# Patient Record
Sex: Male | Born: 1960 | Race: Black or African American | Hispanic: No | Marital: Single | Smoking: Current every day smoker
Health system: Southern US, Community
[De-identification: ages and names within clinical notes are randomized; demographics above are authoritative.]

## PROBLEM LIST (undated history)

## (undated) DIAGNOSIS — E119 Type 2 diabetes mellitus without complications: Secondary | ICD-10-CM

## (undated) DIAGNOSIS — I10 Essential (primary) hypertension: Secondary | ICD-10-CM

## (undated) HISTORY — PX: KNEE ARTHROSCOPY: SUR90

## (undated) HISTORY — DX: Essential (primary) hypertension: I10

## (undated) HISTORY — DX: Type 2 diabetes mellitus without complications: E11.9

---

## 2016-06-25 HISTORY — PX: CORONARY ANGIOPLASTY: SHX604

## 2016-09-12 DIAGNOSIS — I219 Acute myocardial infarction, unspecified: Secondary | ICD-10-CM

## 2016-09-12 HISTORY — DX: Acute myocardial infarction, unspecified: I21.9

## 2020-09-30 ENCOUNTER — Emergency Department (HOSPITAL_COMMUNITY)
Admission: EM | Admit: 2020-09-30 | Discharge: 2020-09-30 | Disposition: A | Discharge status: Home / Self Care | Payer: Medicare PPO | Attending: Emergency Medicine | Admitting: Emergency Medicine

## 2020-09-30 ENCOUNTER — Emergency Department (HOSPITAL_COMMUNITY): Admission: EM | Admit: 2020-09-30 | Payer: Medicare PPO | Source: Home / Self Care

## 2020-09-30 ENCOUNTER — Emergency Department (HOSPITAL_COMMUNITY): Payer: Medicare PPO

## 2020-09-30 DIAGNOSIS — R42 Dizziness and giddiness: Secondary | ICD-10-CM | POA: Diagnosis present

## 2020-09-30 DIAGNOSIS — H5500 Unspecified nystagmus: Secondary | ICD-10-CM | POA: Diagnosis not present

## 2020-09-30 DIAGNOSIS — R110 Nausea: Secondary | ICD-10-CM | POA: Insufficient documentation

## 2020-09-30 DIAGNOSIS — I2510 Atherosclerotic heart disease of native coronary artery without angina pectoris: Secondary | ICD-10-CM | POA: Insufficient documentation

## 2020-09-30 DIAGNOSIS — H53149 Visual discomfort, unspecified: Secondary | ICD-10-CM | POA: Insufficient documentation

## 2020-09-30 DIAGNOSIS — R519 Headache, unspecified: Secondary | ICD-10-CM | POA: Diagnosis not present

## 2020-09-30 LAB — CBC
HCT: 46.2 % (ref 39.0–52.0)
Hemoglobin: 15.8 g/dL (ref 13.0–17.0)
MCH: 28.8 pg (ref 26.0–34.0)
MCHC: 34.2 g/dL (ref 30.0–36.0)
MCV: 84.2 fL (ref 80.0–100.0)
Platelets: 127 10*3/uL — ABNORMAL LOW (ref 150–400)
RBC: 5.49 MIL/uL (ref 4.22–5.81)
RDW: 12.3 % (ref 11.5–15.5)
WBC: 6.5 10*3/uL (ref 4.0–10.5)
nRBC: 0 % (ref 0.0–0.2)

## 2020-09-30 LAB — BASIC METABOLIC PANEL
Anion gap: 5 (ref 5–15)
BUN: 16 mg/dL (ref 8–23)
CO2: 29 mmol/L (ref 22–32)
Calcium: 9.4 mg/dL (ref 8.9–10.3)
Chloride: 101 mmol/L (ref 98–111)
Creatinine, Ser: 1.22 mg/dL (ref 0.61–1.24)
GFR, Estimated: >60 mL/min (ref >60)
Glucose, Bld: 195 mg/dL — ABNORMAL HIGH (ref 70–99)
Potassium: 4.4 mmol/L (ref 3.5–5.1)
Sodium: 135 mmol/L (ref 135–145)

## 2020-09-30 MED ORDER — SODIUM CHLORIDE 0.9 % IV BOLUS
1000.0000 mL | Freq: Once | INTRAVENOUS | Status: AC
  Administered 2020-09-30: 1000 mL via INTRAVENOUS

## 2020-09-30 MED ORDER — PROCHLORPERAZINE EDISYLATE 10 MG/2ML IJ SOLN
5.0000 mg | Freq: Once | INTRAMUSCULAR | Status: AC
  Administered 2020-09-30: 5 mg via INTRAVENOUS
  Filled 2020-09-30: qty 2

## 2020-09-30 NOTE — ED Triage Notes (Signed)
Patient BIB [facility] from home for sudden onset of dizziness, shortness of breath, and nausea starting at approximately 1130. No focal neurological deficits. Received 4mg  zofran, 18g saline lock in left AC. Patient alert and oriented at this time.

## 2020-09-30 NOTE — ED Provider Notes (Signed)
[facility] EMERGENCY DEPARTMENT Provider Note   CSN: [id number] Arrival date & time: 09/30/20  1306     History No chief complaint on file.   Clifford Mcgee is a 60 y.o. male.  HPI Patient presents with dizziness and nausea.  Began acutely at around 1130.  Has had some Zofran by EMS.  States he feels as if the room is spinning around.  Worse if he moves his head.  Worse if he moves around.  Has had nausea.  States he has had some dizziness before that is resolved.  States he has had some ringing in his right ear.  No other numbness or weakness.  States he has had previous heart stents.  States he also has a headache and his vision is a little blurred.    CAD with stent There are no problems to display for this patient.        No family history on file.     Home Medications Prior to Admission medications   Not on File    Allergies    Patient has no allergy information on record.  Review of Systems   Review of Systems  Constitutional: Negative for appetite change.  HENT: Negative for congestion.   Respiratory: Negative for shortness of breath.   Cardiovascular: Negative for chest pain.  Gastrointestinal: Positive for nausea.  Genitourinary: Negative for flank pain.  Musculoskeletal: Negative for back pain.  Skin: Negative for rash.  Neurological: Positive for dizziness.  Psychiatric/Behavioral: Negative for confusion.    Physical Exam Updated Vital Signs BP (!) 154/54 (BP Location: Right Arm)   Pulse (!) 55   Temp 98 F (36.7 C) (Oral)   Resp 16   SpO2 100%   Physical Exam Vitals reviewed.  HENT:     Head: Atraumatic.     Right Ear: Tympanic membrane normal.     Left Ear: Tympanic membrane normal.     Mouth/Throat:     Mouth: Mucous membranes are moist.  Eyes:     Extraocular Movements: Extraocular movements intact.     Pupils: Pupils are equal, round, and reactive to light.     Comments: Nystagmus, particularly with gaze to right.   Cardiovascular:     Rate and Rhythm: Normal rate and regular rhythm.  Pulmonary:     Breath sounds: No wheezing, rhonchi or rales.  Abdominal:     Tenderness: There is no abdominal tenderness.  Musculoskeletal:        General: No tenderness.     Cervical back: Neck supple.  Skin:    General: Skin is warm.     Capillary Refill: Capillary refill takes less than 2 seconds.  Neurological:     Mental Status: He is alert.     Comments: Awake and pleasant.  Face symmetric.  Eye movements intact does have some nystagmus.  Vertigo is different if he turns his head to the right.  Finger-nose intact bilaterally.     ED Results / Procedures / Treatments   Labs (all labs ordered are listed, but only abnormal results are displayed) Labs Reviewed  BASIC METABOLIC PANEL - Abnormal; Notable for the following components:      Result Value   Glucose, Bld 195 (*)    All other components within normal limits  CBC - Abnormal; Notable for the following components:   Platelets 127 (*)    All other components within normal limits    EKG None  Radiology MR BRAIN WO CONTRAST  Result  Date: 09/30/2020 CLINICAL DATA:  Dizziness. EXAM: MRI HEAD WITHOUT CONTRAST TECHNIQUE: Multiplanar, multiecho pulse sequences of the brain and surrounding structures were obtained without intravenous contrast. COMPARISON:  None. FINDINGS: Brain: There is no evidence of an acute infarct, mass, midline shift, or extra-axial fluid collection. A chronic microhemorrhage is noted in the posterior left temporal lobe. T2 hyperintensities in the cerebral white matter bilaterally are nonspecific but compatible with mild chronic small vessel ischemic disease. There are chronic lacunar infarcts in the right putamen and left frontal periventricular white matter. The brainstem and cerebellum are normal in signal. The ventricles and sulci are within normal limits for age. Vascular: Major intracranial vascular flow voids are preserved. Skull  and upper cervical spine: Unremarkable bone marrow signal. Sinuses/Orbits: Unremarkable orbits. Minimal bilateral ethmoid sinus mucosal thickening. Small bilateral mastoid effusions. Other: None. IMPRESSION: 1. No acute intracranial abnormality. 2. Mild chronic small vessel ischemic disease. Electronically Signed   By: Sebastian Ache M.D.   On: 09/30/2020 17:07    Procedures Procedures   Medications Ordered in ED Medications  prochlorperazine (COMPAZINE) injection 5 mg (5 mg Intravenous Given 09/30/20 1338)  sodium chloride 0.9 % bolus 1,000 mL (0 mLs Intravenous Stopped 09/30/20 1456)    ED Course  I have reviewed the triage vital signs and the nursing notes.  Pertinent labs & imaging results that were available during my care of the patient were reviewed by me and considered in my medical decision making (see chart for details).    MDM Rules/Calculators/A&P                          Patient with acute onset vertigo.  Also has headache and mild photophobia.  Some nystagmus.  Had ringing in the ears.  However with patient's comorbidities it feels as if he is at high risk for central cause of vertigo.  We will get MRI.  If negative likely discharge home.  Care turned over to oncoming provider. Final Clinical Impression(s) / ED Diagnoses Final diagnoses:  Vertigo    Rx / DC Orders ED Discharge Orders    None       Benjiman Core, MD 10/01/20 [phone]

## 2020-09-30 NOTE — ED Notes (Signed)
Patient transported to MRI 

## 2020-09-30 NOTE — ED Provider Notes (Signed)
  Physical Exam  BP (!) 162/85 (BP Location: Right Arm)   Pulse 62   Resp 18   SpO2 100%   Physical Exam  ED Course/Procedures     Procedures  MDM  Clifford Mcgee was evaluated for dizziness that seem to be related to peripheral vertigo.  Labs are within normal limits, and his MRI is also normal.  He will be discharged with instructions for symptomatic treatments.       Koleen Distance, MD 09/30/20 [phone]

## 2020-10-31 ENCOUNTER — Ambulatory Visit: Payer: Medicare PPO | Admitting: Cardiology

## 2020-11-07 ENCOUNTER — Encounter: Payer: Self-pay | Admitting: Cardiology

## 2020-11-07 ENCOUNTER — Ambulatory Visit: Payer: Medicare PPO | Admitting: Cardiology

## 2020-11-07 ENCOUNTER — Other Ambulatory Visit: Payer: Self-pay

## 2020-11-07 VITALS — BP 139/72 | HR 62 | Temp 98.1°F | Resp 16 | Ht >= 80 in | Wt 175.2 lb

## 2020-11-07 DIAGNOSIS — F17200 Nicotine dependence, unspecified, uncomplicated: Secondary | ICD-10-CM

## 2020-11-07 DIAGNOSIS — I6523 Occlusion and stenosis of bilateral carotid arteries: Secondary | ICD-10-CM

## 2020-11-07 DIAGNOSIS — I739 Peripheral vascular disease, unspecified: Secondary | ICD-10-CM

## 2020-11-07 DIAGNOSIS — I10 Essential (primary) hypertension: Secondary | ICD-10-CM

## 2020-11-07 DIAGNOSIS — I2510 Atherosclerotic heart disease of native coronary artery without angina pectoris: Secondary | ICD-10-CM

## 2020-11-07 DIAGNOSIS — E782 Mixed hyperlipidemia: Secondary | ICD-10-CM

## 2020-11-07 DIAGNOSIS — E119 Type 2 diabetes mellitus without complications: Secondary | ICD-10-CM

## 2020-11-07 DIAGNOSIS — R1900 Intra-abdominal and pelvic swelling, mass and lump, unspecified site: Secondary | ICD-10-CM

## 2020-11-07 NOTE — Progress Notes (Signed)
Primary Physician/Referring:  Criss Rosales, [facility]  Patient ID: Clifford Mcgee, male    DOB: Oct 26, 1960, 60 y.o.   MRN: [id number]  Chief Complaint  Patient presents with  . Old MI  . New Patient (Initial Visit)    Referred by Lucianne Lei, MD, patient recently moved to [facility] from [place], wants to establish cardiac here in this new city.   HPI:    Clifford Mcgee  is a 60 y.o. African-American male patient with hypertension, hyperlipidemia, diabetes mellitus type 2, coronary artery disease 09/14/2016 in the left circumflex, at that time was also noted to have chronically occluded right coronary artery, asymptomatic bilateral carotid artery stenosis, history of tobacco use disorder referred to me to establish cardiac care.    Past Medical History:  Diagnosis Date  . Diabetes mellitus without complication ([place])   . Heart attack ([place]) 09/12/2016  . Hypertension    Past Surgical History:  Procedure Laterality Date  . CORONARY ANGIOPLASTY  2018  . KNEE ARTHROSCOPY Right    Family History  Problem Relation Age of Onset  . Hypertension Mother   . Heart failure Mother   . Alcoholism Father   . Breast cancer Sister     Social History   Tobacco Use  . Smoking status: Current Every Day Smoker    Packs/day: 0.25    Years: 45.00    Pack years: 11.25    Types: Cigars  . Smokeless tobacco: Never Used  Substance Use Topics  . Alcohol use: Yes    Alcohol/week: 1.0 standard drink    Types: 1 Glasses of wine per week    Comment: seldom   Marital Status: Single  ROS  Review of Systems  Cardiovascular: Negative for chest pain, dyspnea on exertion and leg swelling.  Musculoskeletal: Positive for arthritis.  Gastrointestinal: Negative for melena.   Objective  Blood pressure 139/72, pulse 62, temperature 98.1 F (36.7 C), temperature source Temporal, resp. rate 16, height 8' (2.438 m), weight 175 lb 3.2 oz (79.5 kg), SpO2 98 %. Body mass index is 13.37 kg/m.  Vitals with BMI 11/07/2020  09/30/2020 09/30/2020  Height '8\' 0"'  - -  Weight 175 lbs 3 oz - -  BMI 95.18 - -  Systolic 841 660 630  Diastolic 72 54 85  Pulse 62 55 62   Physical Exam Neck:     Vascular: No carotid bruit or JVD.  Cardiovascular:     Rate and Rhythm: Normal rate and regular rhythm.     Pulses:          Carotid pulses are on the right side with bruit and on the left side with bruit.      Popliteal pulses are 2+ on the right side and 1+ on the left side.       Dorsalis pedis pulses are 1+ on the right side and 1+ on the left side.       Posterior tibial pulses are 1+ on the right side and 1+ on the left side.     Heart sounds: Normal heart sounds. No murmur heard. No gallop.   Pulmonary:     Effort: Pulmonary effort is normal.     Breath sounds: Normal breath sounds.  Abdominal:     General: Bowel sounds are normal.     Palpations: Abdomen is soft. There is pulsatile mass.  Musculoskeletal:        General: No swelling.      Laboratory examination:   Recent Labs    09/30/20  1325  NA 135  K 4.4  CL 101  CO2 29  GLUCOSE 195*  BUN 16  CREATININE 1.22  CALCIUM 9.4  GFRNONAA >60   CrCl cannot be calculated (Patient's most recent lab result is older than the maximum 21 days allowed.).  CMP Latest Ref Rng & Units 09/30/2020  Glucose 70 - 99 mg/dL 195(H)  BUN 8 - 23 mg/dL 16  Creatinine 0.61 - 1.24 mg/dL 1.22  Sodium 135 - 145 mmol/L 135  Potassium 3.5 - 5.1 mmol/L 4.4  Chloride 98 - 111 mmol/L 101  CO2 22 - 32 mmol/L 29  Calcium 8.9 - 10.3 mg/dL 9.4   CBC Latest Ref Rng & Units 09/30/2020  WBC 4.0 - 10.5 K/uL 6.5  Hemoglobin 13.0 - 17.0 g/dL 15.8  Hematocrit 39.0 - 52.0 % 46.2  Platelets 150 - 400 K/uL 127(L)   External labs:   Labs 09/06/2020:  Total cholesterol 176, triglycerides 115, HDL 47, LDL 108.  Non-HDL cholesterol 129.  Serum glucose 94 mg, BUN 12, creatinine 1.30, EGFR 68 mL, potassium 5.2, sodium 142, liver enzymes normal.  Hb 14.8/HCT 44.1, platelets 134.  Normal  indicis.  PSA is normal, A1c 7.1%.  Medications and allergies   Allergies  Allergen Reactions  . Ibuprofen Hives and Rash     Current Outpatient Medications on File Prior to Visit  Medication Sig Dispense Refill  . amLODipine (NORVASC) 5 MG tablet Take 5 mg by mouth at bedtime. X 10 days then 2 tablets by mouth at bedtime for BP    . aspirin 81 MG EC tablet Take 1 tablet by mouth daily.    Marland Kitchen atorvastatin (LIPITOR) 40 MG tablet Take 1 tablet by mouth daily.    . carvedilol (COREG) 12.5 MG tablet Take 1 tablet by mouth 2 (two) times daily.    . hydrochlorothiazide (HYDRODIURIL) 25 MG tablet Take 1 tablet by mouth in the morning.    . irbesartan (AVAPRO) 300 MG tablet Take 1 tablet by mouth at bedtime.     No current facility-administered medications on file prior to visit.    Radiology:   No results found.  Cardiac Studies:   Coronary angiography 09/14/2016: By chart review, angioplasty and stenting to circumflex coronary artery with DES.  CTO RCA.  Mild disease LAD.  3/18 Echo: Normal LV function, mild inferolateral hypokinesis   Carotid ultrasound 09/03/2017: 1. 0 -49% stenosis of the right internal carotid artery with calcified atherosclerotic plaque.  2. 50-69% stenosis of the left internal carotid artery with homogenous atherosclerotic plaque.  3. Severe right external carotid artery stenosis  4. Antegrade flow in both vertebral arteries.  Aortic artery duplex 09/03/2017: Aorta widest diameter  AP 2.7 cm X Lat 2.7cm  Normal size infrarenal aorta with no evidence for aneurysm.  EKG:     EKG 11/07/2020: Sinus bradycardia at rate of 54 bpm, normal axis, poor R wave progression, cannot exclude anteroseptal infarct old.  Early repolarization.  LVH with repolarization abnormality, cannot exclude lateral ischemia.    Assessment     ICD-10-CM   1. Coronary artery disease involving native coronary artery of native heart without angina pectoris  I25.10 PCV ECHOCARDIOGRAM  COMPLETE  2. Asymptomatic bilateral carotid artery stenosis  I65.23 PCV CAROTID DUPLEX (BILATERAL)  3. Essential hypertension  I10 EKG 12-Lead  4. Mixed hyperlipidemia  E78.2   5. Type 2 diabetes mellitus without complication, without long-term current use of insulin (HCC)  E11.9   6. PAD (peripheral artery disease) ([place])  I73.9 PCV ANKLE BRACHIAL INDEX (ABI)     There are no discontinued medications.  No orders of the defined types were placed in this encounter.  Orders Placed This Encounter  Procedures  . EKG 12-Lead  . PCV ECHOCARDIOGRAM COMPLETE    Standing Status:   Future    Standing Expiration Date:   11/07/2021   Recommendations:   Clifford Mcgee is a 60 y.o.  African-American male patient with hypertension, hyperlipidemia, diabetes mellitus type 2, coronary artery disease 09/14/2016 in the left circumflex, at that time was also noted to have chronically occluded right coronary artery, asymptomatic bilateral carotid artery stenosis, history of tobacco use disorder referred to me to establish cardiac care.  Patient with very complex medical history with ongoing tobacco use disorder, diabetes, hypertension and hyperlipidemia, I reviewed his chart extensively on care everywhere, it appears that he was evaluated for coronary disease but not has had any recent stress test or echocardiogram.  Schedule for a Exercise Nuclear stress test to evaluate for myocardial ischemia. Will schedule for an echocardiogram. Schedule for carotid duplex for bruit/follow-up surveillance of carotid stenosis.  He has abnormal physical exam consistent with peripheral arterial disease as well.  Although asymptomatic for restratification, will obtain ABI.  Otherwise he is on appropriate medical therapy, however lipids are not at goal.  I will address this on his next office visit as it was already a prolonged office visit and evaluation of complex medical issues and reconciliation of medical records.  Either  increase atorvastatin to 80 mg or add Zetia.  I have discussed with him regarding smoking cessation.  He smokes a cigar and advised him that in view of vascular disease and known coronary disease he should certainly avoid even smoking 1 cigar a day.  Complete abstinence and the importance of this was discussed with the patient.  I would like to see him back in 6 weeks for follow-up and I will make further recommendations.  This was a >60-minute office visit encounter with reconciliation of medical records, review of external records, and making complex medical decisions.    Adrian Prows, MD, [facility] 11/07/2020, 9:01 PM Office: [phone]

## 2020-12-06 ENCOUNTER — Other Ambulatory Visit: Payer: Medicare PPO

## 2021-01-30 ENCOUNTER — Ambulatory Visit: Payer: Medicare PPO | Admitting: Cardiology

## 2021-10-31 ENCOUNTER — Telehealth: Payer: Self-pay | Admitting: Cardiology

## 2021-10-31 ENCOUNTER — Encounter: Payer: Self-pay | Admitting: Cardiology

## 2021-10-31 NOTE — Telephone Encounter (Signed)
I sent him a dismissal note on Epic mychart. Do not call and reschedule unless he calls himself

## 2021-10-31 NOTE — Telephone Encounter (Signed)
Multiple attempts have been made to reschedule patient's echocardiogram, aorta duplex, and carotid duplex. Patient either no shows or does not respond. We can try to reach out again as the orders will be expiring soon, please advise. ?

## 2022-11-03 IMAGING — MR MR HEAD W/O CM
10 of 11 series · 43 of 48 positions shown · non-contrast
Comparison: None.

CLINICAL DATA: Dizziness.

EXAM:
MRI HEAD WITHOUT CONTRAST
TECHNIQUE: Multiplanar, multiecho pulse sequences of the brain and surrounding
structures were obtained without intravenous contrast.

[Series 5: DWI · axial · 3.0mm · 0.88mm/px · z∈[-66,+85]mm · 10 of 104 slices shown (1 of 4)]
[im 1/104]
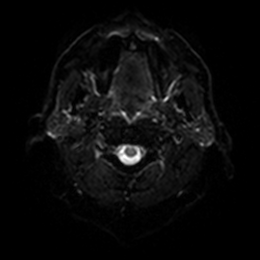
[im 12/104]
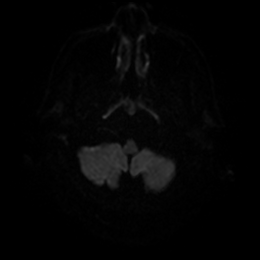
[im 23/104]
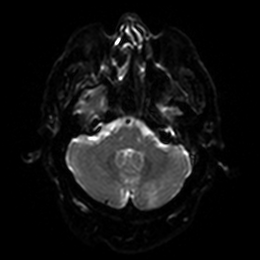
[im 35/104]
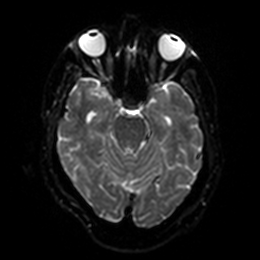
[im 46/104]
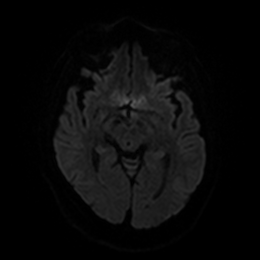
[im 58/104]
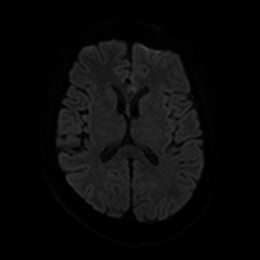
[im 69/104]
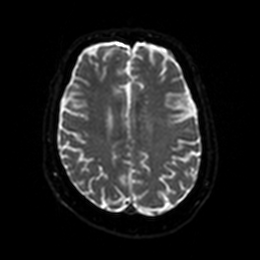
[im 81/104]
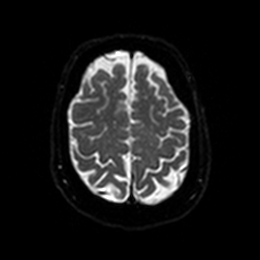
[im 92/104]
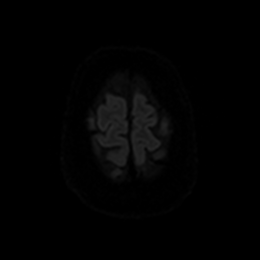
[im 104/104]
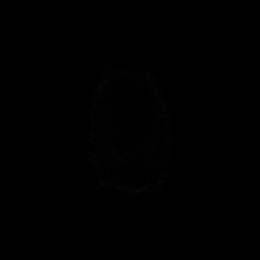

[Series 6: DWI · axial · 3.0mm · 0.88mm/px · z∈[-66,+85]mm · 5 of 52 slices shown (2 of 4)]
[im 1/52]
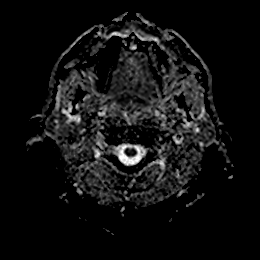
[im 13/52]
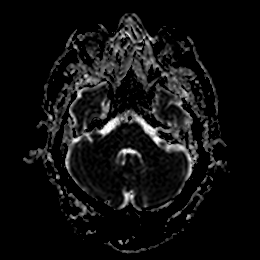
[im 26/52]
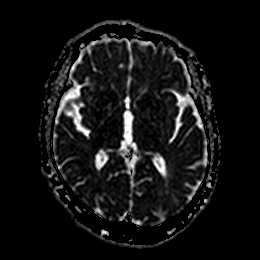
[im 39/52]
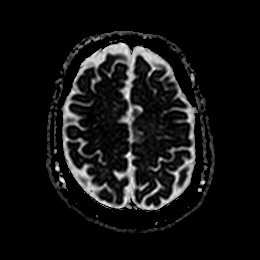
[im 52/52]
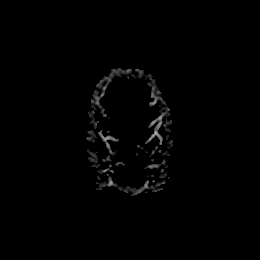

[Series 7: DWI · coronal · 4.0mm · 0.88mm/px · 6 of 72 slices shown (3 of 4)]
[im 1/72]
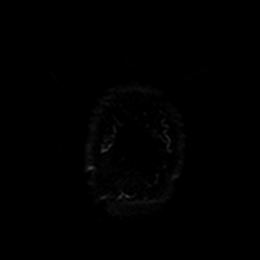
[im 15/72]
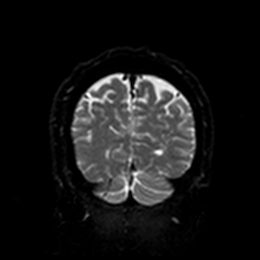
[im 29/72]
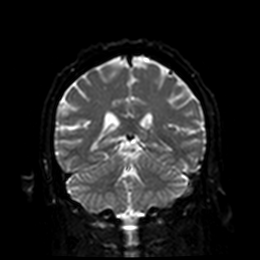
[im 43/72]
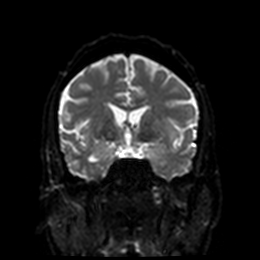
[im 57/72]
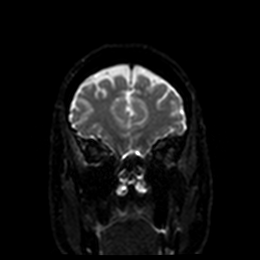
[im 72/72]
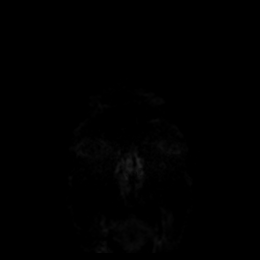

[Series 8: DWI · coronal · 4.0mm · 0.88mm/px · 3 of 36 slices shown (4 of 4)]
[im 1/36]
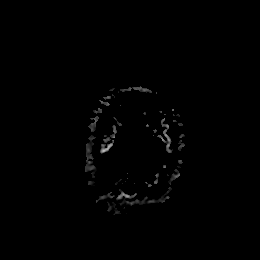
[im 18/36]
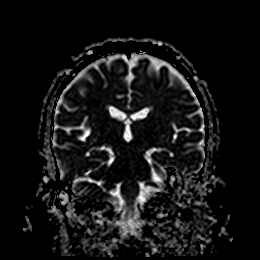
[im 36/36]
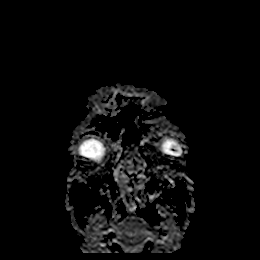

[Series 9: FLAIR · axial · 5.0mm · 0.45mm/px · z∈[-61,+81]mm · 2 of 25 slices shown]
[im 1/25]
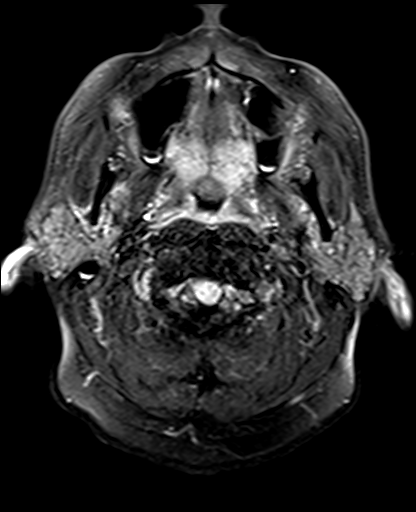
[im 25/25]
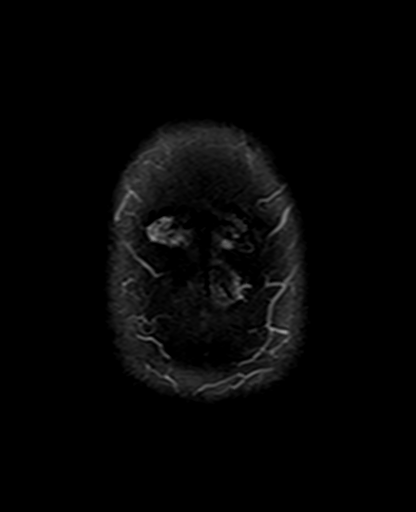

[Series 11: pha_images · axial · 3.0mm · 0.90mm/px · z∈[-72,+88]mm · 5 of 55 slices shown]
[im 1/55]
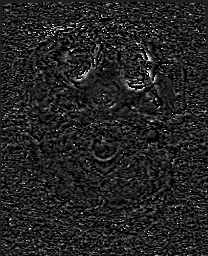
[im 14/55]
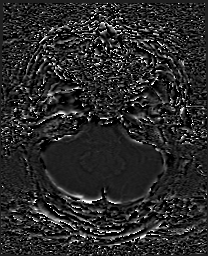
[im 28/55]
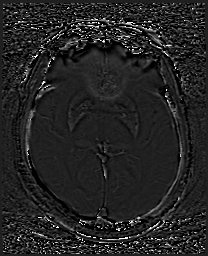
[im 41/55]
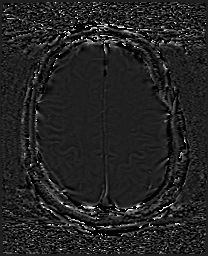
[im 55/55]
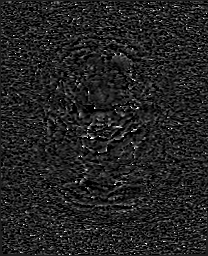

[Series 12: swi_images · axial · 3.0mm · 0.90mm/px · z∈[-72,+91]mm · 5 of 56 slices shown]
[im 1/56]
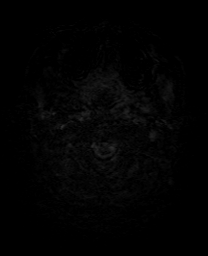
[im 14/56]
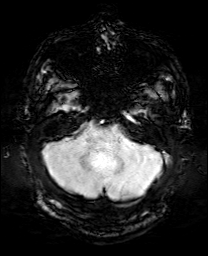
[im 28/56]
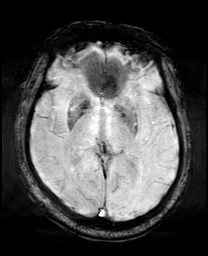
[im 42/56]
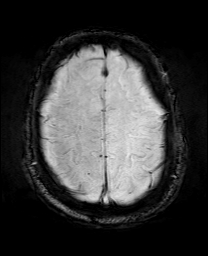
[im 56/56]
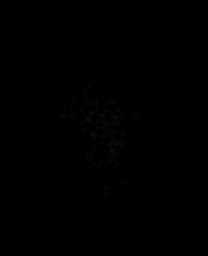

[Series 14: T1 · sagittal · 5.0mm · 0.75mm/px · 2 of 25 slices shown]
[im 1/25]
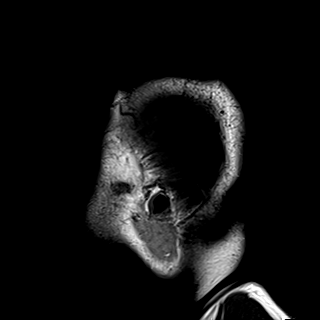
[im 25/25]
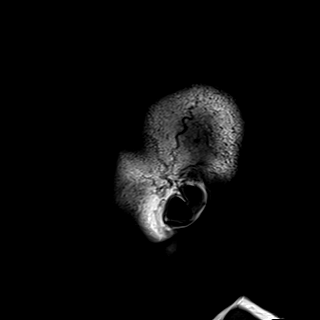

[Series 15: T2 · axial · 5.0mm · 0.72mm/px · z∈[-62,+81]mm · 2 of 25 slices shown (1 of 2)]
[im 1/25]
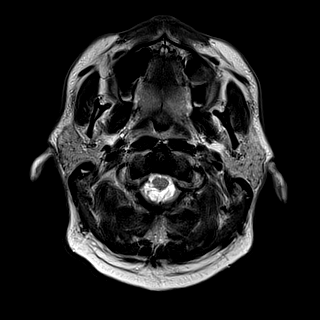
[im 25/25]
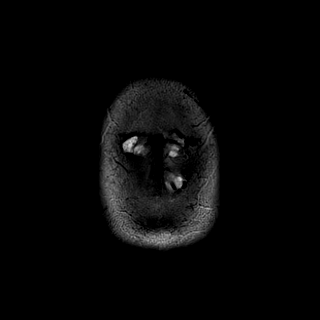

[Series 17: T2 · coronal · 5.0mm · 0.34mm/px · 3 of 29 slices shown (2 of 2)]
[im 1/29]
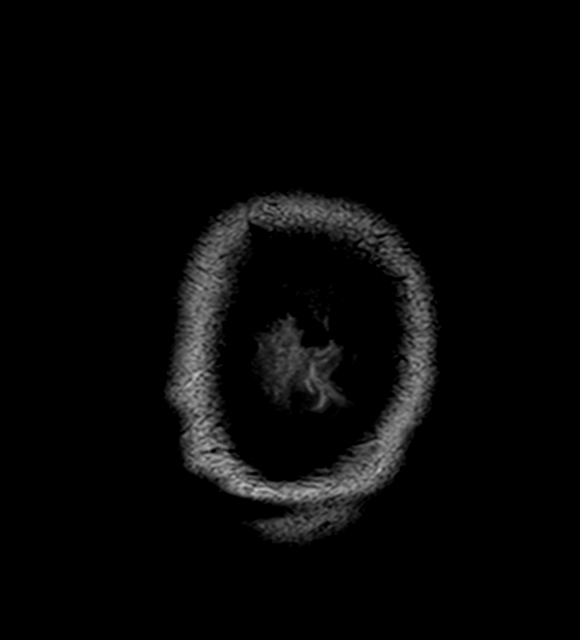
[im 15/29]
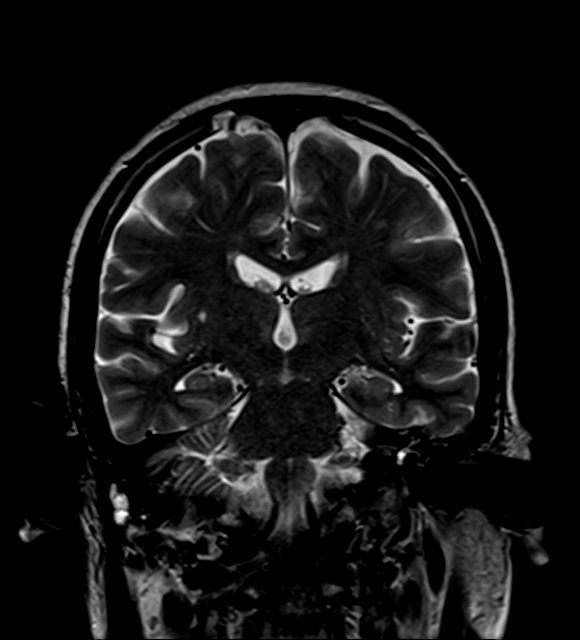
[im 29/29]
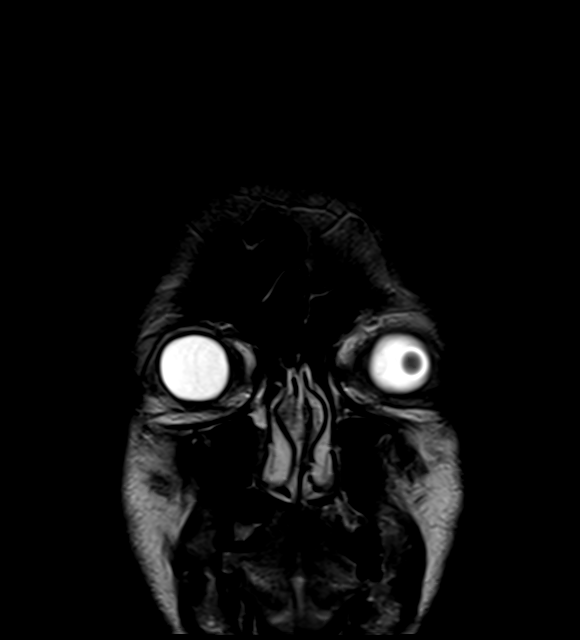

[43 of 48 positions shown; findings below may reference images not displayed]

FINDINGS: Brain: There is no evidence of an acute infarct, mass, midline
shift, or extra-axial fluid collection. A chronic microhemorrhage is
noted in the posterior left temporal lobe. T2 hyperintensities in
the cerebral white matter bilaterally are nonspecific but compatible
with mild chronic small vessel ischemic disease. There are chronic
lacunar infarcts in the right putamen and left frontal
periventricular white matter. The brainstem and cerebellum are
normal in signal. The ventricles and sulci are within normal limits
for age.

Vascular: Major intracranial vascular flow voids are preserved.

Skull and upper cervical spine: Unremarkable bone marrow signal.

Sinuses/Orbits: Unremarkable orbits. Minimal bilateral ethmoid sinus
mucosal thickening. Small bilateral mastoid effusions.

Other: None.
IMPRESSION: 1. No acute intracranial abnormality.
2. Mild chronic small vessel ischemic disease.
